# Patient Record
Sex: Female | Born: 2000 | Race: Black or African American | Hispanic: No | Marital: Single | Smoking: Never smoker
Health system: Southern US, Community
[De-identification: ages and names within clinical notes are randomized; demographics above are authoritative.]

## PROBLEM LIST (undated history)

## (undated) DIAGNOSIS — L309 Dermatitis, unspecified: Secondary | ICD-10-CM

## (undated) DIAGNOSIS — J329 Chronic sinusitis, unspecified: Secondary | ICD-10-CM

## (undated) HISTORY — PX: NO PAST SURGERIES: SHX2092

---
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
## 2020-01-01 ENCOUNTER — Ambulatory Visit: Payer: Self-pay | Attending: Internal Medicine

## 2020-01-01 DIAGNOSIS — Z23 Encounter for immunization: Secondary | ICD-10-CM

## 2020-01-01 NOTE — Progress Notes (Signed)
   Covid-19 Vaccination Clinic  Name:  Marissa Graves    MRN: [id number] DOB: [birth year]  01/01/2020  [name] was observed post Covid-19 immunization for 15 minutes without incident. She was provided with Vaccine Information Sheet and instruction to access the V-Safe system.   [name] was instructed to call 911 with any severe reactions post vaccine: [name] Kitchen Difficulty breathing  . Swelling of face and throat  . A fast heartbeat  . A bad rash all over body  . Dizziness and weakness   Immunizations Administered    Name Date Dose VIS Date Route   Pfizer COVID-19 Vaccine 01/01/2020 11:34 AM 0.3 mL 10/27/2018 Intramuscular   Manufacturer: [employer], [employer]   Lot: [id number]   NDC: [address]-0

## 2020-04-18 ENCOUNTER — Other Ambulatory Visit: Payer: Self-pay

## 2020-04-18 DIAGNOSIS — Z20822 Contact with and (suspected) exposure to covid-19: Secondary | ICD-10-CM

## 2020-04-19 LAB — SARS-COV-2, NAA 2 DAY TAT

## 2020-04-19 LAB — NOVEL CORONAVIRUS, NAA: SARS-CoV-2, NAA: NOT DETECTED

## 2020-10-24 ENCOUNTER — Other Ambulatory Visit: Payer: Self-pay

## 2020-10-24 ENCOUNTER — Ambulatory Visit (INDEPENDENT_AMBULATORY_CARE_PROVIDER_SITE_OTHER): Payer: BC Managed Care – PPO | Admitting: Allergy & Immunology

## 2020-10-24 ENCOUNTER — Encounter: Payer: Self-pay | Admitting: Allergy & Immunology

## 2020-10-24 VITALS — BP 98/68 | HR 73 | Temp 97.9°F | Resp 16 | Ht 64.5 in | Wt 117.8 lb

## 2020-10-24 DIAGNOSIS — T7800XD Anaphylactic reaction due to unspecified food, subsequent encounter: Secondary | ICD-10-CM

## 2020-10-24 DIAGNOSIS — L2089 Other atopic dermatitis: Secondary | ICD-10-CM

## 2020-10-24 DIAGNOSIS — T7800XA Anaphylactic reaction due to unspecified food, initial encounter: Secondary | ICD-10-CM | POA: Insufficient documentation

## 2020-10-24 DIAGNOSIS — J3089 Other allergic rhinitis: Secondary | ICD-10-CM | POA: Diagnosis not present

## 2020-10-24 DIAGNOSIS — J302 Other seasonal allergic rhinitis: Secondary | ICD-10-CM | POA: Diagnosis not present

## 2020-10-24 NOTE — Patient Instructions (Addendum)
1. Seasonal and perennial allergic rhinitis - Testing today showed: trees, indoor molds and outdoor molds - Copy of test results provided.  - Avoidance measures provided. - Continue with: Zyrtec (cetirizine) 10mg  tablet once daily - You can use an extra dose of the antihistamine, if needed, for breakthrough symptoms.  - Consider nasal saline rinses 1-2 times daily to remove allergens from the nasal cavities as well as help with mucous clearance (this is especially helpful to do before the nasal sprays are given) - Consider allergy shots as a means of long-term control. - Allergy shots "re-train" and "reset" the immune system to ignore environmental allergens and decrease the resulting immune response to those allergens (sneezing, itchy watery eyes, runny nose, nasal congestion, etc).     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 - We are getting blood work to confirm this.  - Make an appointment for a peanut challenge on your way out.  - I hope the blood work will be NEGATIVE!   3. Flexural atopic dermatitis - Continue with Aquaphor 1-2 times daily. - Your skin looks great!   4. Return in about 4 weeks (around 11/21/2020) for PEANUT CHALLENGE.    Please inform 11/23/2020 of any Emergency Department visits, hospitalizations, or changes in symptoms. Call us before going to the ED for breathing or allergy symptoms since we might be able to fit you in for a sick visit. Feel free to contact us anytime with any questions, problems, or concerns.  It was a pleasure to see you and your family again today!  Websites that have reliable patient information: 1. American Academy of Asthma, Allergy, and Immunology: www.aaaai.org 2. Food Allergy Research and Education (FARE): foodallergy.org 3. Mothers of Asthmatics: http://www.asthmacommunitynetwork.org 4. American College of Allergy, Asthma, and Immunology: www.acaai.org   COVID-19 Vaccine Information can be found  at: [place] For questions related to vaccine distribution or appointments, please email [email or web address] or call [phone].   We realize that you might be concerned about having an allergic reaction to the COVID19 vaccines. To help with that concern, WE ARE OFFERING THE COVID19 VACCINES IN OUR OFFICE! Ask the front desk for dates!     "Like" [phone] on Facebook and Instagram for our latest updates!      A healthy democracy works best when [place] participate! Make sure you are registered to vote! If you have moved or changed any of your contact information, you will need to get this updated before voting!  In some cases, you MAY be able to register to vote online: [employer]

## 2020-10-24 NOTE — Progress Notes (Signed)
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 Seasonal and perennial allergic rhinitis - Testing today showed: trees, indoor molds and outdoor molds - Copy of test results provided.  - Avoidance measures provided. - Continue with: Zyrtec (cetirizine) 10mg  tablet once daily - You can use an extra dose of the antihistamine, if needed, for breakthrough symptoms.  - Consider nasal saline rinses 1-2 times daily to remove allergens from the nasal cavities as well as help with mucous clearance (this is especially helpful to do before the nasal sprays are given) - Consider allergy shots as a means of long-term control. - Allergy shots "re-train" and "reset" the immune system to ignore environmental allergens and decrease the resulting immune response to those allergens (sneezing, itchy watery eyes, runny nose, nasal congestion, etc).     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 - We are getting blood work to confirm this.  - Make an appointment for a peanut challenge on your way out.  - I hope the blood work will be NEGATIVE!   3. Flexural atopic dermatitis - Continue with Aquaphor 1-2 times daily. - Your skin looks great!   4. Return in about 4 weeks (around 11/21/2020) for PEANUT CHALLENGE.   Subjective:   Marissa Graves is a 20 y.o. female presenting today for evaluation of  Chief Complaint  Patient presents with  . Allergy Testing    Peanuts and enviros    Marissa Graves has a history of the following: Patient Active Problem List   Diagnosis Date Noted  . Seasonal and perennial allergic rhinitis 10/24/2020  . Anaphylactic shock due to adverse food reaction  10/24/2020  . Flexural atopic dermatitis 10/24/2020    History obtained from: chart review and patient and her mother.   [name] was referred by Lenise Arena, MD.     Marissa Graves is a 21 y.o. female presenting for an evaluation of allergic rhinitis as well as food allergies. She is in college for 12 and [employer].    Asthma/Respiratory Symptom History: She did have albuterol nebulizers as a child. She was on Singulair. She does not even have an up to date inhaler.   Allergic Rhinitis Symptom History: She takes cetirizine year round. She has seasonal allergies that are worse in the spring and the fall. She does not use a nose spray. She does not get antibiotics for sinus infections.   Food Allergy Symptom History: She was diagnosed when she was very young. She reports that she had facial swelling. This was around the age of 6 months. She had testing done shortly thereafter. She had epinephrine from testing for testing. This was the last time that she was tested. The whoel family became nut free. She is eating tree nuts without a problem. She does not have an up to date EpiPen.   Eczema Symptom History: She does have eczema. She uses Aquaphor. She uses this only as needed. It was worse when she was little. The Zyrtec seems to help.   Otherwise, there is no history of other atopic diseases, including drug allergies, stinging insect allergies, urticaria or contact dermatitis. There is no significant infectious history. Vaccinations are up to date.    Past Medical  History: Patient Active Problem List   Diagnosis Date Noted  . Seasonal and perennial allergic rhinitis 10/24/2020  . Anaphylactic shock due to adverse food reaction 10/24/2020  . Flexural atopic dermatitis 10/24/2020    Medication List:  Allergies as of 10/24/2020      Reactions   Peanut-containing Drug Products Anaphylaxis      Medication List       Accurate as of October 24, 2020  6:25 PM. If you have  any questions, ask your nurse or doctor.        STOP taking these medications   budesonide 0.25 MG/2ML nebulizer solution Commonly known as: PULMICORT Stopped by: Alfonse Spruce, MD     TAKE these medications   acetaminophen 160 MG/5ML elixir Commonly known as: TYLENOL Take 240 mg by mouth every 4 (four) hours as needed. For pain   albuterol 108 (90 Base) MCG/ACT inhaler Commonly known as: VENTOLIN HFA Inhale 2 puffs into the lungs every 6 (six) hours as needed. For shortness of breath   cetirizine 10 MG tablet Commonly known as: ZYRTEC Take 10 mg by mouth daily.   EPINEPHrine 0.15 MG/0.3ML injection Commonly known as: EPIPEN JR Inject 0.15 mg into the muscle as needed. For severe allergic reactions.   ibuprofen 100 MG/5ML suspension Commonly known as: ADVIL Take 200 mg by mouth every 6 (six) hours as needed. For pain       Birth History: non-contributory  Developmental History: non-contributory  Past Surgical History: Past Surgical History:  Procedure Laterality Date  . NO PAST SURGERIES       Family History: Family History  Problem Relation Age of Onset  . Allergic rhinitis Mother   . Asthma Father   . Angioedema Neg Hx   . Atopy Neg Hx   . Eczema Neg Hx   . Urticaria Neg Hx   . Immunodeficiency Neg Hx      Social History: Adrijana lives at home with her family.  She lives in a house that is 20 years old.  There is carpeting throughout the house.  She has gas heating and central cooling.  There are no animals inside or outside of the home.  There are dust mite covers on the pillows, but not the bed.  There is no tobacco exposure.  She is currently a sophomore in college.  She is not exposed to fumes, chemicals, or dust in her home, but she is in school.  She does use a HEPA filter in the home.  She does not live near an interstate or industrial area.   Review of Systems  Constitutional: Negative.  Negative for chills, fever, malaise/fatigue and  weight loss.  HENT: Positive for congestion. Negative for ear discharge, ear pain and sinus pain.   Eyes: Negative for pain, discharge and redness.  Respiratory: Negative for cough, sputum production, shortness of breath and wheezing.        Positive for history of asthma.  Cardiovascular: Negative.  Negative for chest pain and palpitations.  Gastrointestinal: Negative for abdominal pain, constipation, diarrhea, heartburn, nausea and vomiting.  Skin: Positive for itching.  Neurological: Negative for dizziness and headaches.  Endo/Heme/Allergies: Positive for environmental allergies. Does not bruise/bleed easily.       Positive for food allergies.       Objective:   Blood pressure 98/68, pulse 73, temperature 97.9 F (36.6 C), temperature source Temporal, resp. rate 16, height 5' 4.5" (1.638 m), weight 117 lb 12.8 oz (53.4 kg), SpO2 98 %. Body  mass index is 19.91 kg/m.   Physical Exam:   Physical Exam Constitutional:      Appearance: She is well-developed.     Comments: Very pleasant female.  HENT:     Head: Normocephalic and atraumatic.     Right Ear: Tympanic membrane, ear canal and external ear normal. No drainage, swelling or tenderness. Tympanic membrane is not injected, scarred, erythematous, retracted or bulging.     Left Ear: Tympanic membrane, ear canal and external ear normal. No drainage, swelling or tenderness. Tympanic membrane is not injected, scarred, erythematous, retracted or bulging.     Nose: No nasal deformity, septal deviation, mucosal edema, rhinorrhea or epistaxis.     Right Turbinates: Enlarged, swollen and pale.     Left Turbinates: Enlarged, swollen and pale.     Right Sinus: No maxillary sinus tenderness or frontal sinus tenderness.     Left Sinus: No maxillary sinus tenderness or frontal sinus tenderness.     Comments: Clear rhinorrhea.    Mouth/Throat:     Mouth: Oropharynx is clear and moist. Mucous membranes are not pale and not dry.      Pharynx: Uvula midline.     Comments: Cobblestoning in posterior oropharynx. Eyes:     General:        Right eye: No discharge.        Left eye: No discharge.     Extraocular Movements: EOM normal.     Conjunctiva/sclera: Conjunctivae normal.     Right eye: Right conjunctiva is not injected. No chemosis.    Left eye: Left conjunctiva is not injected. No chemosis.    Pupils: Pupils are equal, round, and reactive to light.  Cardiovascular:     Rate and Rhythm: Normal rate and regular rhythm.     Heart sounds: Normal heart sounds.  Pulmonary:     Effort: Pulmonary effort is normal. No tachypnea, accessory muscle usage or respiratory distress.     Breath sounds: Normal breath sounds. No wheezing, rhonchi or rales.     Comments: Moving air well in all lung fields.  No increased work of breathing. Chest:     Chest wall: No tenderness.  Abdominal:     Tenderness: There is no abdominal tenderness. There is no guarding or rebound.  Lymphadenopathy:     Head:     Right side of head: No submandibular, tonsillar or occipital adenopathy.     Left side of head: No submandibular, tonsillar or occipital adenopathy.     Cervical: No cervical adenopathy.  Skin:    General: Skin is warm.     Capillary Refill: Capillary refill takes less than 2 seconds.     Coloration: Skin is not pale.     Findings: No abrasion, erythema, petechiae or rash. Rash is not papular, urticarial or vesicular.     Comments: No eczematous or urticarial lesions noted.  Neurological:     Mental Status: She is alert.  Psychiatric:        Mood and Affect: Mood and affect normal.        Behavior: Behavior is cooperative.      Diagnostic studies:  Allergy Studies:     Airborne Adult Perc - 10/24/20 1358    Time Antigen Placed 1400    Location Back    Number of Test 59    1. Control-Buffer 50% Glycerol Negative    2. Control-Histamine 1 mg/ml 3+    3. Albumin saline Negative    4. Bahia Negative  5. French [place]  Negative    6. Johnson Negative    7. Kentucky Blue Negative    8. Meadow Fescue Negative    9. Perennial Rye Negative    10. Sweet Vernal Negative    11. Timothy Negative    12. Cocklebur Negative    13. Burweed Marshelder Negative    14. Ragweed, short Negative    15. Ragweed, Giant Negative    16. Plantain,  English Negative    17. Lamb's Quarters Negative    18. Sheep Sorrell Negative    19. Rough Pigweed Negative    20. Marsh Elder, Rough Negative    21. Mugwort, Common Negative    22. Ash mix 2+    23. Birch mix Negative    24. Beech American Negative    25. Box, Elder Negative    26. Cedar, red Negative    27. Cottonwood, Guinea-[place]issau Negative    28. Elm mix Negative    29. Hickory Negative    30. Maple mix Negative    31. Oak, [place] mix Negative    32. Pecan Pollen Negative    33. Pine mix Negative    34. Sycamore Eastern Negative    35. Walnut, Black Pollen Negative    36. Alternaria alternata Negative    37. Cladosporium Herbarum 3+    38. Aspergillus mix 3+    39. Penicillium mix Negative    40. Bipolaris sorokiniana (Helminthosporium) 3+    41. Drechslera spicifera (Curvularia) Negative    42. Mucor plumbeus Negative    43. Fusarium moniliforme 3+    44. Aureobasidium pullulans (pullulara) Negative    45. Rhizopus oryzae Negative    46. Botrytis cinera Negative    47. Epicoccum nigrum 3+    48. Phoma betae Negative    49. Candida Albicans Negative    50. Trichophyton mentagrophytes Negative    51. Mite, D Farinae  5,000 AU/ml Negative    52. Mite, D Pteronyssinus  5,000 AU/ml Negative    53. Cat Hair 10,000 BAU/ml Negative    54.  Dog Epithelia Negative    55. Mixed Feathers Negative    56. Horse Epithelia Negative    57. Cockroach, German Negative    58. Mouse Negative    59. Tobacco Leaf Negative          Food Perc - 10/24/20 1359      Test Information   Time Antigen Placed 1400    Allergen Manufacturer Waynette Buttery    Location Back    Number of  allergen test 1      Food   1. Peanut --   +/-          Allergy testing results were read and interpreted by myself, documented by clinical staff.         Malachi Bonds, MD [facility]

## 2020-10-27 LAB — IGE PEANUT COMPONENT PROFILE
F352-IgE Ara h 8: <0.1 kU/L
F422-IgE Ara h 1: 4.01 kU/L — AB
F423-IgE Ara h 2: 30.2 kU/L — AB
F424-IgE Ara h 3: 3.03 kU/L — AB
F427-IgE Ara h 9: <0.1 kU/L
F447-IgE Ara h 6: 15.1 kU/L — AB

## 2021-01-15 ENCOUNTER — Other Ambulatory Visit: Payer: Self-pay | Admitting: Internal Medicine

## 2021-01-16 ENCOUNTER — Other Ambulatory Visit: Payer: Self-pay | Admitting: Internal Medicine

## 2021-01-16 DIAGNOSIS — N6310 Unspecified lump in the right breast, unspecified quadrant: Secondary | ICD-10-CM

## 2022-04-17 DIAGNOSIS — N6314 Unspecified lump in the right breast, lower inner quadrant: Secondary | ICD-10-CM | POA: Diagnosis not present

## 2022-04-19 DIAGNOSIS — N644 Mastodynia: Secondary | ICD-10-CM | POA: Diagnosis not present

## 2023-02-24 DIAGNOSIS — Z01419 Encounter for gynecological examination (general) (routine) without abnormal findings: Secondary | ICD-10-CM | POA: Diagnosis not present

## 2023-02-24 DIAGNOSIS — Z0000 Encounter for general adult medical examination without abnormal findings: Secondary | ICD-10-CM | POA: Diagnosis not present

## 2023-02-24 DIAGNOSIS — J3089 Other allergic rhinitis: Secondary | ICD-10-CM | POA: Diagnosis not present

## 2023-02-24 DIAGNOSIS — T7800XA Anaphylactic reaction due to unspecified food, initial encounter: Secondary | ICD-10-CM | POA: Diagnosis not present

## 2023-02-24 DIAGNOSIS — L2089 Other atopic dermatitis: Secondary | ICD-10-CM | POA: Diagnosis not present
